# Patient Record
Sex: Male | Born: 1965 | Hispanic: No | Marital: Married | Smoking: Never smoker
Health system: Southern US, Community
[De-identification: ages and names within clinical notes are randomized; demographics above are authoritative.]

## PROBLEM LIST (undated history)

## (undated) DIAGNOSIS — E785 Hyperlipidemia, unspecified: Secondary | ICD-10-CM

## (undated) DIAGNOSIS — K219 Gastro-esophageal reflux disease without esophagitis: Secondary | ICD-10-CM

## (undated) HISTORY — DX: Gastro-esophageal reflux disease without esophagitis: K21.9

## (undated) HISTORY — DX: Hyperlipidemia, unspecified: E78.5

---

## 2004-09-16 ENCOUNTER — Emergency Department: Payer: Self-pay | Admitting: Emergency Medicine

## 2005-05-22 ENCOUNTER — Emergency Department: Payer: Self-pay | Admitting: Emergency Medicine

## 2006-05-24 ENCOUNTER — Emergency Department: Payer: Self-pay | Admitting: Emergency Medicine

## 2012-02-09 ENCOUNTER — Emergency Department (HOSPITAL_COMMUNITY)
Admission: EM | Admit: 2012-02-09 | Discharge: 2012-02-09 | Discharge status: Against Medical Advice | Payer: Self-pay | Attending: Emergency Medicine | Admitting: Emergency Medicine

## 2012-02-09 ENCOUNTER — Encounter (HOSPITAL_COMMUNITY): Payer: Self-pay | Admitting: Emergency Medicine

## 2012-02-09 DIAGNOSIS — R209 Unspecified disturbances of skin sensation: Secondary | ICD-10-CM | POA: Insufficient documentation

## 2012-02-09 DIAGNOSIS — M79609 Pain in unspecified limb: Secondary | ICD-10-CM | POA: Insufficient documentation

## 2012-02-09 NOTE — ED Notes (Signed)
C/o left leg had episode of numbness, aching now, itching. Started last night. Wears compression socks suggested by private doctor

## 2012-02-09 NOTE — ED Notes (Signed)
Has to "fidget" and constantly move legs at night also , unable to sit still.

## 2012-02-09 NOTE — ED Notes (Signed)
Called for room without answer.  

## 2015-12-21 ENCOUNTER — Ambulatory Visit: Admission: RE | Admit: 2015-12-21 | Payer: 59 | Source: Ambulatory Visit | Admitting: Unknown Physician Specialty

## 2015-12-21 ENCOUNTER — Encounter: Admission: RE | Payer: Self-pay | Source: Ambulatory Visit

## 2015-12-21 SURGERY — COLONOSCOPY WITH PROPOFOL
Anesthesia: General

## 2016-10-10 DIAGNOSIS — Z0000 Encounter for general adult medical examination without abnormal findings: Secondary | ICD-10-CM | POA: Diagnosis not present

## 2016-10-17 DIAGNOSIS — Z0000 Encounter for general adult medical examination without abnormal findings: Secondary | ICD-10-CM | POA: Diagnosis not present

## 2016-12-18 DIAGNOSIS — L821 Other seborrheic keratosis: Secondary | ICD-10-CM | POA: Diagnosis not present

## 2016-12-18 DIAGNOSIS — L719 Rosacea, unspecified: Secondary | ICD-10-CM | POA: Diagnosis not present

## 2017-02-03 DIAGNOSIS — H5310 Unspecified subjective visual disturbances: Secondary | ICD-10-CM | POA: Diagnosis not present

## 2017-09-01 DIAGNOSIS — R05 Cough: Secondary | ICD-10-CM | POA: Diagnosis not present

## 2017-10-17 DIAGNOSIS — M19011 Primary osteoarthritis, right shoulder: Secondary | ICD-10-CM | POA: Diagnosis not present

## 2017-10-17 DIAGNOSIS — M67911 Unspecified disorder of synovium and tendon, right shoulder: Secondary | ICD-10-CM | POA: Diagnosis not present

## 2017-10-23 DIAGNOSIS — Z0000 Encounter for general adult medical examination without abnormal findings: Secondary | ICD-10-CM | POA: Diagnosis not present

## 2017-10-26 DIAGNOSIS — Z0000 Encounter for general adult medical examination without abnormal findings: Secondary | ICD-10-CM | POA: Diagnosis not present

## 2017-11-03 DIAGNOSIS — Z862 Personal history of diseases of the blood and blood-forming organs and certain disorders involving the immune mechanism: Secondary | ICD-10-CM | POA: Insufficient documentation

## 2017-11-03 DIAGNOSIS — R7303 Prediabetes: Secondary | ICD-10-CM | POA: Insufficient documentation

## 2017-11-17 DIAGNOSIS — J069 Acute upper respiratory infection, unspecified: Secondary | ICD-10-CM | POA: Diagnosis not present

## 2017-11-17 DIAGNOSIS — R05 Cough: Secondary | ICD-10-CM | POA: Diagnosis not present

## 2017-11-30 DIAGNOSIS — M25511 Pain in right shoulder: Secondary | ICD-10-CM | POA: Diagnosis not present

## 2017-12-09 DIAGNOSIS — M25511 Pain in right shoulder: Secondary | ICD-10-CM | POA: Diagnosis not present

## 2017-12-11 DIAGNOSIS — M25511 Pain in right shoulder: Secondary | ICD-10-CM | POA: Diagnosis not present

## 2017-12-16 DIAGNOSIS — M25511 Pain in right shoulder: Secondary | ICD-10-CM | POA: Diagnosis not present

## 2017-12-18 DIAGNOSIS — M25511 Pain in right shoulder: Secondary | ICD-10-CM | POA: Diagnosis not present

## 2017-12-23 DIAGNOSIS — M25511 Pain in right shoulder: Secondary | ICD-10-CM | POA: Diagnosis not present

## 2017-12-25 DIAGNOSIS — M25511 Pain in right shoulder: Secondary | ICD-10-CM | POA: Diagnosis not present

## 2018-02-24 DIAGNOSIS — S90222A Contusion of left lesser toe(s) with damage to nail, initial encounter: Secondary | ICD-10-CM | POA: Diagnosis not present

## 2018-02-24 DIAGNOSIS — L821 Other seborrheic keratosis: Secondary | ICD-10-CM | POA: Diagnosis not present

## 2018-03-09 DIAGNOSIS — M9902 Segmental and somatic dysfunction of thoracic region: Secondary | ICD-10-CM | POA: Diagnosis not present

## 2018-03-09 DIAGNOSIS — M542 Cervicalgia: Secondary | ICD-10-CM | POA: Diagnosis not present

## 2018-03-09 DIAGNOSIS — M9901 Segmental and somatic dysfunction of cervical region: Secondary | ICD-10-CM | POA: Diagnosis not present

## 2018-03-11 DIAGNOSIS — M9901 Segmental and somatic dysfunction of cervical region: Secondary | ICD-10-CM | POA: Diagnosis not present

## 2018-03-11 DIAGNOSIS — M542 Cervicalgia: Secondary | ICD-10-CM | POA: Diagnosis not present

## 2018-03-11 DIAGNOSIS — M9902 Segmental and somatic dysfunction of thoracic region: Secondary | ICD-10-CM | POA: Diagnosis not present

## 2018-03-12 DIAGNOSIS — Z862 Personal history of diseases of the blood and blood-forming organs and certain disorders involving the immune mechanism: Secondary | ICD-10-CM | POA: Diagnosis not present

## 2018-03-12 DIAGNOSIS — R7303 Prediabetes: Secondary | ICD-10-CM | POA: Diagnosis not present

## 2018-03-15 DIAGNOSIS — M9902 Segmental and somatic dysfunction of thoracic region: Secondary | ICD-10-CM | POA: Diagnosis not present

## 2018-03-15 DIAGNOSIS — M9901 Segmental and somatic dysfunction of cervical region: Secondary | ICD-10-CM | POA: Diagnosis not present

## 2018-03-15 DIAGNOSIS — M542 Cervicalgia: Secondary | ICD-10-CM | POA: Diagnosis not present

## 2018-03-16 DIAGNOSIS — M9902 Segmental and somatic dysfunction of thoracic region: Secondary | ICD-10-CM | POA: Diagnosis not present

## 2018-03-16 DIAGNOSIS — M542 Cervicalgia: Secondary | ICD-10-CM | POA: Diagnosis not present

## 2018-03-16 DIAGNOSIS — M9901 Segmental and somatic dysfunction of cervical region: Secondary | ICD-10-CM | POA: Diagnosis not present

## 2018-03-18 DIAGNOSIS — M9901 Segmental and somatic dysfunction of cervical region: Secondary | ICD-10-CM | POA: Diagnosis not present

## 2018-03-18 DIAGNOSIS — M542 Cervicalgia: Secondary | ICD-10-CM | POA: Diagnosis not present

## 2018-03-18 DIAGNOSIS — M9902 Segmental and somatic dysfunction of thoracic region: Secondary | ICD-10-CM | POA: Diagnosis not present

## 2018-03-23 DIAGNOSIS — M9902 Segmental and somatic dysfunction of thoracic region: Secondary | ICD-10-CM | POA: Diagnosis not present

## 2018-03-23 DIAGNOSIS — M9901 Segmental and somatic dysfunction of cervical region: Secondary | ICD-10-CM | POA: Diagnosis not present

## 2018-03-23 DIAGNOSIS — M542 Cervicalgia: Secondary | ICD-10-CM | POA: Diagnosis not present

## 2018-05-12 DIAGNOSIS — M9901 Segmental and somatic dysfunction of cervical region: Secondary | ICD-10-CM | POA: Diagnosis not present

## 2018-05-12 DIAGNOSIS — M542 Cervicalgia: Secondary | ICD-10-CM | POA: Diagnosis not present

## 2018-05-12 DIAGNOSIS — M9902 Segmental and somatic dysfunction of thoracic region: Secondary | ICD-10-CM | POA: Diagnosis not present

## 2018-05-17 DIAGNOSIS — M9901 Segmental and somatic dysfunction of cervical region: Secondary | ICD-10-CM | POA: Diagnosis not present

## 2018-05-17 DIAGNOSIS — M542 Cervicalgia: Secondary | ICD-10-CM | POA: Diagnosis not present

## 2018-05-17 DIAGNOSIS — M9902 Segmental and somatic dysfunction of thoracic region: Secondary | ICD-10-CM | POA: Diagnosis not present

## 2018-07-21 DIAGNOSIS — L719 Rosacea, unspecified: Secondary | ICD-10-CM | POA: Diagnosis not present

## 2018-08-30 DIAGNOSIS — M9901 Segmental and somatic dysfunction of cervical region: Secondary | ICD-10-CM | POA: Diagnosis not present

## 2018-08-30 DIAGNOSIS — M542 Cervicalgia: Secondary | ICD-10-CM | POA: Diagnosis not present

## 2018-08-30 DIAGNOSIS — M9902 Segmental and somatic dysfunction of thoracic region: Secondary | ICD-10-CM | POA: Diagnosis not present

## 2018-09-17 DIAGNOSIS — J069 Acute upper respiratory infection, unspecified: Secondary | ICD-10-CM | POA: Diagnosis not present

## 2018-10-14 DIAGNOSIS — R7303 Prediabetes: Secondary | ICD-10-CM | POA: Diagnosis not present

## 2018-10-14 DIAGNOSIS — Z862 Personal history of diseases of the blood and blood-forming organs and certain disorders involving the immune mechanism: Secondary | ICD-10-CM | POA: Diagnosis not present

## 2018-10-25 DIAGNOSIS — M9901 Segmental and somatic dysfunction of cervical region: Secondary | ICD-10-CM | POA: Diagnosis not present

## 2018-10-25 DIAGNOSIS — M9902 Segmental and somatic dysfunction of thoracic region: Secondary | ICD-10-CM | POA: Diagnosis not present

## 2018-10-25 DIAGNOSIS — M542 Cervicalgia: Secondary | ICD-10-CM | POA: Diagnosis not present

## 2018-10-29 DIAGNOSIS — Z862 Personal history of diseases of the blood and blood-forming organs and certain disorders involving the immune mechanism: Secondary | ICD-10-CM | POA: Diagnosis not present

## 2018-10-29 DIAGNOSIS — Z0000 Encounter for general adult medical examination without abnormal findings: Secondary | ICD-10-CM | POA: Diagnosis not present

## 2018-10-29 DIAGNOSIS — R7303 Prediabetes: Secondary | ICD-10-CM | POA: Diagnosis not present

## 2018-11-05 DIAGNOSIS — Z0000 Encounter for general adult medical examination without abnormal findings: Secondary | ICD-10-CM | POA: Diagnosis not present

## 2018-11-12 DIAGNOSIS — Z01818 Encounter for other preprocedural examination: Secondary | ICD-10-CM | POA: Diagnosis not present

## 2018-11-12 DIAGNOSIS — Z1211 Encounter for screening for malignant neoplasm of colon: Secondary | ICD-10-CM | POA: Diagnosis not present

## 2019-01-28 ENCOUNTER — Ambulatory Visit: Admit: 2019-01-28 | Payer: 59 | Admitting: Unknown Physician Specialty

## 2019-01-28 SURGERY — COLONOSCOPY WITH PROPOFOL
Anesthesia: General

## 2019-12-14 DIAGNOSIS — E781 Pure hyperglyceridemia: Secondary | ICD-10-CM | POA: Insufficient documentation

## 2020-02-28 ENCOUNTER — Other Ambulatory Visit: Payer: Self-pay

## 2020-02-29 ENCOUNTER — Ambulatory Visit (INDEPENDENT_AMBULATORY_CARE_PROVIDER_SITE_OTHER): Payer: 59 | Admitting: Gastroenterology

## 2020-02-29 ENCOUNTER — Other Ambulatory Visit: Payer: Self-pay

## 2020-02-29 ENCOUNTER — Encounter: Payer: Self-pay | Admitting: Gastroenterology

## 2020-02-29 VITALS — BP 105/66 | HR 85 | Temp 98.1°F | Ht 70.0 in | Wt 187.8 lb

## 2020-02-29 DIAGNOSIS — K219 Gastro-esophageal reflux disease without esophagitis: Secondary | ICD-10-CM | POA: Diagnosis not present

## 2020-02-29 MED ORDER — OMEPRAZOLE 20 MG PO CPDR: 20.0000 mg | DELAYED_RELEASE_CAPSULE | Freq: Every day | ORAL | 0 refills | Status: DC

## 2020-02-29 NOTE — Patient Instructions (Addendum)
Please start taking your Omeprazole 20 MG 1 tablet daily for the next 30 days.     Gastroesophageal Reflux Disease, Adult Gastroesophageal reflux (GER) happens when acid from the stomach flows up into the tube that connects the mouth and the stomach (esophagus). Normally, food travels down the esophagus and stays in the stomach to be digested. With GER, food and stomach acid sometimes move back up into the esophagus. You may have a disease called gastroesophageal reflux disease (GERD) if the reflux:  Happens often.  Causes frequent or very bad symptoms.  Causes problems such as damage to the esophagus. When this happens, the esophagus becomes sore and swollen (inflamed). Over time, GERD can make small holes (ulcers) in the lining of the esophagus. What are the causes? This condition is caused by a problem with the muscle between the esophagus and the stomach. When this muscle is weak or not normal, it does not close properly to keep food and acid from coming back up from the stomach. The muscle can be weak because of:  Tobacco use.  Pregnancy.  Having a certain type of hernia (hiatal hernia).  Alcohol use.  Certain foods and drinks, such as coffee, chocolate, onions, and peppermint. What increases the risk? You are more likely to develop this condition if you:  Are overweight.  Have a disease that affects your connective tissue.  Use NSAID medicines. What are the signs or symptoms? Symptoms of this condition include:  Heartburn.  Difficult or painful swallowing.  The feeling of having a lump in the throat.  A bitter taste in the mouth.  Bad breath.  Having a lot of saliva.  Having an upset or bloated stomach.  Belching.  Chest pain. Different conditions can cause chest pain. Make sure you see your doctor if you have chest pain.  Shortness of breath or noisy breathing (wheezing).  Ongoing (chronic) cough or a cough at night.  Wearing away of the surface of  teeth (tooth enamel).  Weight loss. How is this treated? Treatment will depend on how bad your symptoms are. Your doctor may suggest:  Changes to your diet.  Medicine.  Surgery. Follow these instructions at home: Eating and drinking   Follow a diet as told by your doctor. You may need to avoid foods and drinks such as: ? Coffee and tea (with or without caffeine). ? Drinks that contain alcohol. ? Energy drinks and sports drinks. ? Bubbly (carbonated) drinks or sodas. ? Chocolate and cocoa. ? Peppermint and mint flavorings. ? Garlic and onions. ? Horseradish. ? Spicy and acidic foods. These include peppers, chili powder, curry powder, vinegar, hot sauces, and BBQ sauce. ? Citrus fruit juices and citrus fruits, such as oranges, lemons, and limes. ? Tomato-based foods. These include red sauce, chili, salsa, and pizza with red sauce. ? Fried and fatty foods. These include donuts, french fries, potato chips, and high-fat dressings. ? High-fat meats. These include hot dogs, rib eye steak, sausage, ham, and bacon. ? High-fat dairy items, such as whole milk, butter, and cream cheese.  Eat small meals often. Avoid eating large meals.  Avoid drinking large amounts of liquid with your meals.  Avoid eating meals during the 2-3 hours before bedtime.  Avoid lying down right after you eat.  Do not exercise right after you eat. Lifestyle   Do not use any products that contain nicotine or tobacco. These include cigarettes, e-cigarettes, and chewing tobacco. If you need help quitting, ask your doctor.  Try to lower  your stress. If you need help doing this, ask your doctor.  If you are overweight, lose an amount of weight that is healthy for you. Ask your doctor about a safe weight loss goal. General instructions  Pay attention to any changes in your symptoms.  Take over-the-counter and prescription medicines only as told by your doctor. Do not take aspirin, ibuprofen, or other NSAIDs  unless your doctor says it is okay.  Wear loose clothes. Do not wear anything tight around your waist.  Raise (elevate) the head of your bed about 6 inches (15 cm).  Avoid bending over if this makes your symptoms worse.  Keep all follow-up visits as told by your doctor. This is important. Contact a doctor if:  You have new symptoms.  You lose weight and you do not know why.  You have trouble swallowing or it hurts to swallow.  You have wheezing or a cough that keeps happening.  Your symptoms do not get better with treatment.  You have a hoarse voice. Get help right away if:  You have pain in your arms, neck, jaw, teeth, or back.  You feel sweaty, dizzy, or light-headed.  You have chest pain or shortness of breath.  You throw up (vomit) and your throw-up looks like blood or coffee grounds.  You pass out (faint).  Your poop (stool) is bloody or black.  You cannot swallow, drink, or eat. Summary  If a person has gastroesophageal reflux disease (GERD), food and stomach acid move back up into the esophagus and cause symptoms or problems such as damage to the esophagus.  Treatment will depend on how bad your symptoms are.  Follow a diet as told by your doctor.  Take all medicines only as told by your doctor. This information is not intended to replace advice given to you by your health care provider. Make sure you discuss any questions you have with your health care provider. Document Revised: 03/17/2018 Document Reviewed: 03/17/2018 Elsevier Patient Education  [place].

## 2020-03-02 NOTE — Progress Notes (Signed)
Benjamin Cox [address]  [place]  [place], [place] [address]  Main: [phone]  Fax: [phone]   Gastroenterology Consultation  Referring Provider:     Dion Body, MD Primary Care Physician:  Dion Body, MD Reason for Consultation:    Reflux        HPI:    Chief complaint: Heartburn  Benjamin Cox is a 54 y.o. y/o male referred for consultation & management  by Dr. Dion Body, MD.  Patient reports heartburn 3-4 times a week.  Does not take anything daily for it.  Takes as needed over-the-counter antacids which help.  No dysphagia to solids or liquids.  Does have some discomfort swallowing big pills, but not otherwise.  No nausea or vomiting.  No altered bowel habits or blood in stool.  No prior EGD or colonoscopy.  No family history of colon cancer.  History reviewed. No pertinent past medical history.  History reviewed. No pertinent surgical history.  Prior to Admission medications   Medication Sig Start Date End Date Taking? Authorizing Provider  albuterol (VENTOLIN HFA) 108 (90 Base) MCG/ACT inhaler Inhale 2 puffs into the lungs every 4 (four) hours as needed. 09/01/17 09/11/20 Yes [provider]  Biotin 10 MG TABS Take 1 tablet by mouth daily.   Yes [provider]  calcium-vitamin D (OSCAL WITH D) 250-125 MG-UNIT per tablet Take 1 tablet by mouth daily.   Yes [provider]  finasteride (PROPECIA) 1 MG tablet Take 1 mg by mouth daily.   Yes [provider]  Multiple Vitamin (MULTIVITAMIN) capsule Take 1 capsule by mouth daily.   Yes [provider]  vitamin C (ASCORBIC ACID) 500 MG tablet Take 500 mg by mouth daily.   Yes [provider]  omeprazole (PRILOSEC) 20 MG capsule Take 1 capsule (20 mg total) by mouth daily. 02/29/20   Virgel Manifold, MD    History reviewed. No pertinent family history.   Social History   Tobacco Use  . Smoking status: Never Smoker  .  Smokeless tobacco: Never Used  Substance Use Topics  . Alcohol use: No  . Drug use: No    Allergies as of 02/29/2020 - Review Complete 02/09/2012  Allergen Reaction Noted  . Other Other (See Comments) 05/26/2014    Review of Systems:    All systems reviewed and negative except where noted in HPI.   Physical Exam:  BP 105/66   Pulse 85   Temp 98.1 F (36.7 C) (Oral)   Ht 5\' 10"  (1.778 m)   Wt 187 lb 12.8 oz (85.2 kg)   BMI 26.95 kg/m  No LMP for male patient. Psych:  Alert and cooperative. Normal mood and affect. General:   Alert,  Well-developed, well-nourished, pleasant and cooperative in NAD Head:  Normocephalic and atraumatic. Eyes:  Sclera clear, no icterus.   Conjunctiva pink. Ears:  Normal auditory acuity. Nose:  No deformity, discharge, or lesions. Mouth:  No deformity or lesions,oropharynx pink & moist. Neck:  Supple; no masses or thyromegaly. Abdomen:  Normal bowel sounds.  No bruits.  Soft, non-tender and non-distended without masses, hepatosplenomegaly or hernias noted.  No guarding or rebound tenderness.    Msk:  Symmetrical without gross deformities. Good, equal movement & strength bilaterally. Pulses:  Normal pulses noted. Extremities:  No clubbing or edema.  No cyanosis. Neurologic:  Alert and oriented x3;  grossly normal neurologically. Skin:  Intact without significant lesions or rashes. No jaundice. Lymph Nodes:  No significant  cervical adenopathy. Psych:  Alert and cooperative. Normal mood and affect.   Labs: CBC No results found for: WBC, RBC, HGB, HCT, PLT, MCV, MCH, MCHC, RDW, LYMPHSABS, MONOABS, EOSABS, BASOSABS CMP  No results found for: NA, K, CL, CO2, GLUCOSE, BUN, CREATININE, CALCIUM, PROT, ALBUMIN, AST, ALT, ALKPHOS, BILITOT, GFRNONAA, GFRAA  Imaging Studies: No results found.  Assessment and Plan:   Benjamin Cox is a 54 y.o. y/o male has been referred for reflux  Patient educated extensively on acid reflux lifestyle  modification, including buying a bed wedge, not eating 3 hrs before bedtime, diet modifications, and handout given for the same.   Start low-dose PPI once daily for short duration due to frequent symptoms.  (Risks of PPI use were discussed with patient including bone loss, C. Diff diarrhea, pneumonia, infections, CKD, electrolyte abnormalities.  Pt. Verbalizes understanding and chooses to continue the medication.)  We discussed option of proceeding with EGD and risks and benefits were discussed in detail. Pt would like to wait and see if medication helps before proceeding which is reasonable.  We also discussed screening colonoscopy and risks and benefits of the same.  Patient refuses at this time.  I have encouraged him to call us to schedule this when he is ready to get this done.  Follow-up in clinic in 6 to 8 weeks to reassess symptoms and change medications or further management as needed    Dr Melodie Bouillon  Speech recognition software was used to dictate the above note.

## 2020-03-22 ENCOUNTER — Ambulatory Visit: Payer: 59 | Admitting: Gastroenterology

## 2020-04-19 ENCOUNTER — Telehealth: Payer: Self-pay

## 2020-04-19 NOTE — Telephone Encounter (Signed)
Patient called to see if he could be sooner then 05/02/20. Informed patient that was the first appointment she would have

## 2020-05-02 ENCOUNTER — Other Ambulatory Visit: Payer: Self-pay

## 2020-05-02 ENCOUNTER — Encounter: Payer: Self-pay | Admitting: Gastroenterology

## 2020-05-02 ENCOUNTER — Ambulatory Visit (INDEPENDENT_AMBULATORY_CARE_PROVIDER_SITE_OTHER): Payer: 59 | Admitting: Gastroenterology

## 2020-05-02 VITALS — BP 117/79 | HR 96 | Temp 97.8°F | Wt 188.0 lb

## 2020-05-02 DIAGNOSIS — R1310 Dysphagia, unspecified: Secondary | ICD-10-CM

## 2020-05-02 DIAGNOSIS — K219 Gastro-esophageal reflux disease without esophagitis: Secondary | ICD-10-CM

## 2020-05-02 MED ORDER — OMEPRAZOLE 20 MG PO CPDR: 20.0000 mg | DELAYED_RELEASE_CAPSULE | Freq: Two times a day (BID) | ORAL | 1 refills | Status: DC

## 2020-05-02 NOTE — Patient Instructions (Signed)
Upper Endoscopy, Adult Upper endoscopy is a procedure to look inside the upper GI (gastrointestinal) tract. The upper GI tract is made up of:  The part of the body that moves food from your mouth to your stomach (esophagus).  The stomach.  The first part of your small intestine (duodenum). This procedure is also called esophagogastroduodenoscopy (EGD) or gastroscopy. In this procedure, your health care provider passes a thin, flexible tube (endoscope) through your mouth and down your esophagus into your stomach. A small camera is attached to the end of the tube. Images from the camera appear on a monitor in the exam room. During this procedure, your health care provider may also remove a small piece of tissue to be sent to a lab and examined under a microscope (biopsy). Your health care provider may do an upper endoscopy to diagnose cancers of the upper GI tract. You may also have this procedure to find the cause of other conditions, such as:  Stomach pain.  Heartburn.  Pain or problems when swallowing.  Nausea and vomiting.  Stomach bleeding.  Stomach ulcers. Tell a health care provider about:  Any allergies you have.  All medicines you are taking, including vitamins, herbs, eye drops, creams, and over-the-counter medicines.  Any problems you or family members have had with anesthetic medicines.  Any blood disorders you have.  Any surgeries you have had.  Any medical conditions you have.  Whether you are pregnant or may be pregnant. What are the risks? Generally, this is a safe procedure. However, problems may occur, including:  Infection.  Bleeding.  Allergic reactions to medicines.  A tear or hole (perforation) in the esophagus, stomach, or duodenum. What happens before the procedure? Staying hydrated Follow instructions from your health care provider about hydration, which may include:  Up to 2 hours before the procedure - you may continue to drink clear  liquids, such as water, clear fruit juice, black coffee, and plain tea.  Eating and drinking restrictions Follow instructions from your health care provider about eating and drinking, which may include:  8 hours before the procedure - stop eating heavy meals or foods, such as meat, fried foods, or fatty foods.  6 hours before the procedure - stop eating light meals or foods, such as toast or cereal.  6 hours before the procedure - stop drinking milk or drinks that contain milk.  2 hours before the procedure - stop drinking clear liquids. Medicines Ask your health care provider about:  Changing or stopping your regular medicines. This is especially important if you are taking diabetes medicines or blood thinners.  Taking medicines such as aspirin and ibuprofen. These medicines can thin your blood. Do not take these medicines unless your health care provider tells you to take them.  Taking over-the-counter medicines, vitamins, herbs, and supplements. General instructions  Plan to have someone take you home from the hospital or clinic.  If you will be going home right after the procedure, plan to have someone with you for 24 hours.  Ask your health care provider what steps will be taken to help prevent infection. What happens during the procedure?   An IV will be inserted into one of your veins.  You may be given one or more of the following: ? A medicine to help you relax (sedative). ? A medicine to numb the throat (local anesthetic).  You will lie on your left side on an exam table.  Your health care provider will pass the endoscope through   your mouth and down your esophagus.  Your health care provider will use the scope to check the inside of your esophagus, stomach, and duodenum. Biopsies may be taken.  The endoscope will be removed. The procedure may vary among health care providers and hospitals. What happens after the procedure?  Your blood pressure, heart rate,  breathing rate, and blood oxygen level will be monitored until you leave the hospital or clinic.  Do not drive for 24 hours if you were given a sedative during your procedure.  When your throat is no longer numb, you may be given some fluids to drink.  It is up to you to get the results of your procedure. Ask your health care provider, or the department that is doing the procedure, when your results will be ready. Summary  Upper endoscopy is a procedure to look inside the upper GI tract.  During the procedure, an IV will be inserted into one of your veins. You may be given a medicine to help you relax.  A medicine will be used to numb your throat.  The endoscope will be passed through your mouth and down your esophagus. This information is not intended to replace advice given to you by your health care provider. Make sure you discuss any questions you have with your health care provider. Document Revised: 03/03/2018 Document Reviewed: 02/08/2018 Elsevier Patient Education  2020 Elsevier Inc.   

## 2020-05-03 NOTE — Progress Notes (Signed)
Melodie Bouillon, MD [address]  [address]  [place], [place] [address]  Main: [phone]  Fax: [phone]   Primary Care Physician: Marisue Ivan, MD   Chief Complaint  Patient presents with   Gastroesophageal Reflux    HPI: Benjamin Cox is a 54 y.o. male here for follow-up of reflux.  With Prilosec once daily his heartburn has completely resolved, however he continues to report globus sensation.  Also is now reporting "choking" on solid foods intermittently.  Happened last night with an apple.  No weight loss, no nausea or vomiting.  Current Outpatient Medications  Medication Sig Dispense Refill   Biotin 10 MG TABS Take 1 tablet by mouth daily.     Multiple Vitamin (MULTIVITAMIN) capsule Take 1 capsule by mouth daily.     vitamin C (ASCORBIC ACID) 500 MG tablet Take 500 mg by mouth daily.     omeprazole (PRILOSEC) 20 MG capsule Take 1 capsule (20 mg total) by mouth 2 (two) times daily before a meal. 60 capsule 1   No current facility-administered medications for this visit.    Allergies as of 05/02/2020 - Review Complete 05/02/2020  Allergen Reaction Noted   Other Other (See Comments) 05/26/2014    ROS:  General: Negative for anorexia, weight loss, fever, chills, fatigue, weakness. ENT: Negative for hoarseness, difficulty swallowing , nasal congestion. CV: Negative for chest pain, angina, palpitations, dyspnea on exertion, peripheral edema.  Respiratory: Negative for dyspnea at rest, dyspnea on exertion, cough, sputum, wheezing.  GI: See history of present illness. GU:  Negative for dysuria, hematuria, urinary incontinence, urinary frequency, nocturnal urination.  Endo: Negative for unusual weight change.    Physical Examination:   BP 117/79    Pulse 96    Temp 97.8 F (36.6 C) (Oral)    Wt 188 lb (85.3 kg)    BMI 26.98 kg/m   General: Well-nourished, well-developed in no acute distress.  Eyes: No icterus. Conjunctivae pink. Mouth:  Oropharyngeal mucosa moist and pink , no lesions erythema or exudate. Neck: Supple, Trachea midline Abdomen: Bowel sounds are normal, nontender, nondistended, no hepatosplenomegaly or masses, no abdominal bruits or hernia , no rebound or guarding.   Extremities: No lower extremity edema. No clubbing or deformities. Neuro: Alert and oriented x 3.  Grossly intact. Skin: Warm and dry, no jaundice.   Psych: Alert and cooperative, normal mood and affect.   Labs: CMP  No results found for: NA, K, CL, CO2, GLUCOSE, BUN, CREATININE, CALCIUM, PROT, ALBUMIN, AST, ALT, ALKPHOS, BILITOT, GFRNONAA, GFRAA No results found for: WBC, HGB, HCT, MCV, PLT  Imaging Studies: No results found.  Assessment and Plan:   Benjamin Cox is a 54 y.o. y/o male here for follow-up of reflux  Heartburn is well controlled, but continues to report globus sensation daily.  Now is also reporting intermittent dysphagia  We recommended proceeding with an upper endoscopy at this time, specifically due to his dysphagia  However, patient would like to continue with conservative management at this time and if symptoms do not improve in the next 3 to 4 weeks, he will call us to schedule an upper endoscopy at that point  Increase PPI to twice daily and patient advised to let us know in 3 to 4 weeks if symptoms are not any better  (Risks of PPI use were discussed with patient including bone loss, C. Diff diarrhea, pneumonia, infections, CKD, electrolyte abnormalities.  Pt. Verbalizes understanding and chooses to continue the medication.)  Dr Melodie Bouillon

## 2020-07-02 ENCOUNTER — Telehealth: Payer: Self-pay | Admitting: Gastroenterology

## 2020-07-02 NOTE — Telephone Encounter (Signed)
Patient LVM stating he would be out of town on appt date 07/04/20 and to cancel his appt..I LVM for patient to call our office if he needs to reschedule.

## 2020-07-04 ENCOUNTER — Ambulatory Visit: Payer: 59 | Admitting: Gastroenterology

## 2022-01-15 ENCOUNTER — Ambulatory Visit: Payer: 59 | Admitting: Cardiovascular Disease

## 2022-02-13 ENCOUNTER — Ambulatory Visit (INDEPENDENT_AMBULATORY_CARE_PROVIDER_SITE_OTHER): Payer: Commercial Managed Care - PPO | Admitting: Cardiovascular Disease

## 2022-02-13 ENCOUNTER — Encounter: Payer: Self-pay | Admitting: Cardiovascular Disease

## 2022-02-13 VITALS — BP 122/82 | HR 68 | Ht 71.0 in | Wt 188.4 lb

## 2022-02-13 DIAGNOSIS — R0602 Shortness of breath: Secondary | ICD-10-CM | POA: Diagnosis not present

## 2022-02-13 DIAGNOSIS — R072 Precordial pain: Secondary | ICD-10-CM

## 2022-02-13 DIAGNOSIS — E785 Hyperlipidemia, unspecified: Secondary | ICD-10-CM

## 2022-02-13 MED ORDER — METOPROLOL TARTRATE 100 MG PO TABS: ORAL_TABLET | ORAL | 0 refills | Status: DC

## 2022-02-13 NOTE — Patient Instructions (Addendum)
Medication Instructions:  Your physician recommends that you continue on your current medications as directed. Please refer to the Current Medication list given to you today.  A one time doe of Metoprolol 100 mg tablet to be taken 2 hours prior to your Cardiac CTA has been sent to your pharmacy.  *If you need a refill on your cardiac medications before your next appointment, please call your pharmacy*   Lab Work: None ordered If you have labs (blood work) drawn today and your tests are completely normal, you will receive your results only by: [place] (if you have MyChart) OR A paper copy in the mail If you have any lab test that is abnormal or we need to change your treatment, we will call you to review the results.   Testing/Procedures: Your physician has requested that you have an echocardiogram. Echocardiography is a painless test that uses sound waves to create images of your heart. It provides your doctor with information about the size and shape of your heart and how well your heart's chambers and valves are working. This procedure takes approximately one hour. There are no restrictions for this procedure.   Your physician has requested that you have cardiac CT. Cardiac computed tomography (CT) is a painless test that uses an x-ray machine to take clear, detailed pictures of your heart. For further information please visit [email or web address]. Please follow instruction sheet as given.     Follow-Up: At [facility], you and your health needs are our priority.  As part of our continuing mission to provide you with exceptional heart care, we have created designated Provider Care Teams.  These Care Teams include your primary Cardiologist (physician) and Advanced Practice Providers (APPs -  Physician Assistants and Nurse Practitioners) who all work together to provide you with the care you need, when you need it.  We recommend signing up for the patient portal called "MyChart".   Sign up information is provided on this After Visit Summary.  MyChart is used to connect with patients for Virtual Visits (Telemedicine).  Patients are able to view lab/test results, encounter notes, upcoming appointments, etc.  Non-urgent messages can be sent to your provider as well.   To learn more about what you can do with MyChart, go to [email or web address].    Your next appointment:   As needed pending test results  The format for your next appointment:   In Person  Provider:   You may see Kathlyn Sacramento, MD or one of the following Advanced Practice Providers on your designated Care Team:   Murray Hodgkins, NP Christell Faith, PA-C Cadence Kathlen Mody, [place]   Other Instructions    Your cardiac CT will be scheduled at one of the below location:    [facility] [address] [place], [place] [address] [phone]    If scheduled at [facility], please arrive 15 mins early for check-in and test prep.  Please follow these instructions carefully (unless otherwise directed):  Hold all erectile dysfunction medications at least 3 days (72 hrs) prior to test.  On the Night Before the Test: Be sure to Drink plenty of water. Do not consume any caffeinated/decaffeinated beverages or chocolate 12 hours prior to your test. Do not take any antihistamines 12 hours prior to your test.  On the Day of the Test: Drink plenty of water until 1 hour prior to the test. Do not eat any food 4 hours prior to the test. You may take  your regular medications prior to the test.  Take metoprolol (Lopressor) two hours prior to test.         After the Test: Drink plenty of water. After receiving IV contrast, you may experience a mild flushed feeling. This is normal. On occasion, you may experience a mild rash up to 24 hours after the test. This is not dangerous. If this occurs, you can take Benadryl 25 mg and increase your  fluid intake. If you experience trouble breathing, this can be serious. If it is severe call 911 IMMEDIATELY. If it is mild, please call our office. If you take any of these medications: Glipizide/Metformin, Avandament, Glucavance, please do not take 48 hours after completing test unless otherwise instructed.  We will call to schedule your test 2-4 weeks out understanding that some insurance companies will need an authorization prior to the service being performed.   For non-scheduling related questions, please contact the cardiac imaging nurse navigator should you have any questions/concerns: Marchia Bond, Cardiac Imaging Nurse Navigator Gordy Clement, Cardiac Imaging Nurse Navigator [facility] Direct Office Dial: [phone]   For scheduling needs, including cancellations and rescheduling, please call [place], [phone].   Important Information About Sugar

## 2022-02-13 NOTE — Progress Notes (Signed)
Cardiology Office Note   Date:  02/14/2022   ID:  Benjamin Cox, DOB [birth year], MRN [id number]  PCP:  Marisue Ivan, MD  Cardiologist:   Lorine Bears, MD   Chief Complaint  Patient presents with   NEW patient-Patient c/o fatigue and swelling in his feet.    Patient also reports DOE. He also notes cramping in the top of his feet (worse on the left side) most nights.      History of Present Illness: Benjamin Cox is a 56 y.o. male who is self-referred for evaluation of exertional dyspnea.  He has no prior cardiac history.  He has no history of hypertension or tobacco use.  He does have diet-controlled diabetes and mild hyperlipidemia.  Family history is negative for premature coronary artery disease.  His father had CVA at the age of 41 as well as hypertension. Over the last 6 months, he noted gradual exertional fatigue and dyspnea with exertion when he tries to exercise.  He tries to go to the gym but has been limited by shortness of breath.  No chest pain or palpitations.  He also increased increased leg edema.  No orthopnea or PND.  No previous cardiac work-up.  Past Medical History:  Diagnosis Date   GERD (gastroesophageal reflux disease)    Hyperlipidemia     History reviewed. No pertinent surgical history.   Current Outpatient Medications  Medication Sig Dispense Refill   Biotin 10 MG TABS Take 1 tablet by mouth daily.     metoprolol tartrate (LOPRESSOR) 100 MG tablet Take 1 tablet (100 mg) 2 hours prior to your Cardiac CTA 1 tablet 0   Multiple Vitamin (MULTIVITAMIN) capsule Take 1 capsule by mouth daily.     No current facility-administered medications for this visit.    Allergies:   Other    Social History:  The patient  reports that he has never smoked. He has never used smokeless tobacco. He reports current alcohol use of about 1.0 standard drink per week. He reports that he does not use drugs.   Family History:  The patient's family history  includes Hypertension in his father; Stroke in his father.    ROS:  Please see the history of present illness.   Otherwise, review of systems are positive for none.   All other systems are reviewed and negative.    PHYSICAL EXAM: VS:  BP 122/82 (BP Location: Right Arm, Patient Position: Sitting, Cuff Size: Normal)   Pulse 68   Ht 5\' 11"  (1.803 m)   Wt 188 lb 6.4 oz (85.5 kg)   SpO2 96%   BMI 26.28 kg/m  , BMI Body mass index is 26.28 kg/m. GEN: Well nourished, well developed, in no acute distress  HEENT: normal  Neck: no JVD, carotid bruits, or masses Cardiac: RRR; no murmurs, rubs, or gallops,no edema  Respiratory:  clear to auscultation bilaterally, normal work of breathing GI: soft, nontender, nondistended, + BS MS: no deformity or atrophy  Skin: warm and dry, no rash Neuro:  Strength and sensation are intact Psych: euthymic mood, full affect   EKG:  EKG is ordered today. The ekg ordered today demonstrates normal sinus rhythm with no significant ST or T wave changes.   Recent Labs: No results found for requested labs within last 8760 hours.    Lipid Panel No results found for: CHOL, TRIG, HDL, CHOLHDL, VLDL, LDLCALC, LDLDIRECT    Wt Readings from Last 3 Encounters:  02/13/22 188 lb 6.4 oz (85.5 kg)  05/02/20 188 lb (85.3 kg)  02/29/20 187 lb 12.8 oz (85.2 kg)          02/13/2022    3:51 PM  PAD Screen  Previous PAD dx? No  Previous surgical procedure? No  Pain with walking? No  Feet/toe relief with dangling? Yes  Painful, non-healing ulcers? No  Extremities discolored? Yes      ASSESSMENT AND PLAN:  1.  Severe exertional dyspnea: Worrisome for anginal equivalent considering his risk factors including diet-controlled diabetes and hyperlipidemia.  EKG does not show any ischemic changes.  I recommend evaluation with cardiac CTA. I also recommend an echocardiogram to evaluate LV systolic and diastolic function.  2.  Mild hyperlipidemia:I reviewed his  most recent labs which showed an LDL of 116.  If cardiac CTA shows evidence of atherosclerosis, we will plan on starting treatment with a statin considering that he has diet-controlled diabetes.    Disposition:   FU as needed based on cardiac testing.  Signed,  Kathlyn Sacramento, MD  02/14/2022 2:36 PM    [facility]

## 2022-02-14 ENCOUNTER — Encounter: Payer: Self-pay | Admitting: Cardiovascular Disease

## 2022-03-05 ENCOUNTER — Telehealth (HOSPITAL_COMMUNITY): Payer: Self-pay | Admitting: Emergency Medicine

## 2022-03-05 NOTE — Telephone Encounter (Signed)
Reaching out to patient to offer assistance regarding upcoming cardiac imaging study; pt verbalizes understanding of appt date/time, parking situation and where to check in, pre-test NPO status and medications ordered, and verified current allergies; name and call back number provided for further questions should they arise Benjamin Bond RN Navigator Cardiac Imaging [facility] [phone] office [phone] cell  Reminded to pick up metoprolol tartrate Denies iv issues

## 2022-03-06 ENCOUNTER — Other Ambulatory Visit: Payer: 59

## 2022-03-06 ENCOUNTER — Ambulatory Visit
Admission: RE | Admit: 2022-03-06 | Discharge: 2022-03-06 | Disposition: A | Discharge status: Home / Self Care | Payer: Commercial Managed Care - PPO | Source: Ambulatory Visit | Attending: Cardiovascular Disease | Admitting: Cardiovascular Disease

## 2022-03-06 DIAGNOSIS — R072 Precordial pain: Secondary | ICD-10-CM

## 2022-03-06 MED ORDER — METOPROLOL TARTRATE 5 MG/5ML IV SOLN
10.0000 mg | Freq: Once | INTRAVENOUS | Status: AC
  Administered 2022-03-06: 10 mg via INTRAVENOUS

## 2022-03-06 MED ORDER — NITROGLYCERIN 0.4 MG SL SUBL
0.8000 mg | SUBLINGUAL_TABLET | Freq: Once | SUBLINGUAL | Status: AC
Start: 2022-03-06 — End: 2022-03-06
  Administered 2022-03-06: 0.8 mg via SUBLINGUAL

## 2022-03-06 MED ORDER — IOHEXOL 350 MG/ML SOLN
75.0000 mL | Freq: Once | INTRAVENOUS | Status: AC | PRN
  Administered 2022-03-06: 75 mL via INTRAVENOUS

## 2022-03-06 NOTE — Progress Notes (Signed)
Patient tolerated procedure well. Ambulate w/o difficulty. Denies light headedness or being dizzy. Sitting in chair drinking water provided. Encouraged to drink extra water today and reasoning explained. Verbalized understanding. All questions answered. ABC intact. No further needs. Discharge from procedure area w/o issues.   °

## 2022-03-07 ENCOUNTER — Telehealth: Payer: Self-pay

## 2022-03-07 DIAGNOSIS — E781 Pure hyperglyceridemia: Secondary | ICD-10-CM

## 2022-03-07 MED ORDER — ROSUVASTATIN CALCIUM 10 MG PO TABS
10.0000 mg | ORAL_TABLET | Freq: Every day | ORAL | 5 refills | Status: DC
Start: 2022-03-07 — End: 2022-07-14

## 2022-03-07 NOTE — Telephone Encounter (Addendum)
Called the patient to give Cardiac CTA results. Called both telephone numbers listed for the patient. cell rings out and msg sts that the call cannot be completed at this time. Lmtcb on the pt home line.

## 2022-03-07 NOTE — Telephone Encounter (Signed)
Patient returning call and req call back on [phone] number.

## 2022-03-07 NOTE — Telephone Encounter (Signed)
-----   Message from Iran Ouch, MD sent at 03/07/2022  7:50 AM EDT ----- Inform patient that cardiac CTA showed mild coronary plaque buildup with no evidence of obstructive disease.  This is good news overall but we do need to treat his cholesterol to prevent further progression.  I recommend adding rosuvastatin 10 mg daily.  Check lipid and liver profile in 6 weeks.  Schedule a follow-up visit with me after his echocardiogram.

## 2022-03-07 NOTE — Telephone Encounter (Signed)
Patient made aware of Cardiac CTA results and Dr. Jari Sportsman recommendation. Patient is agreeable with starting rosuvastatin 10 mg daily. Rx sent to the patients pharmacy. Order placed for 6 wk fasting lipid and lft [facility]. Appt scheduled with Dr. Kirke Corin after the patients echo. Patient would prefer a Fri appt. Appt scheduled on 04/11/22 @10 :20 am with Dr. . Patient made aware of the appt date, time, and location. Patient questions answered with verbalized understanding. Patient voiced appreciation for the call.

## 2022-03-28 ENCOUNTER — Ambulatory Visit (INDEPENDENT_AMBULATORY_CARE_PROVIDER_SITE_OTHER): Payer: Commercial Managed Care - PPO

## 2022-03-28 DIAGNOSIS — R0602 Shortness of breath: Secondary | ICD-10-CM

## 2022-03-28 LAB — ECHOCARDIOGRAM COMPLETE
AR max vel: 3.03 cm2
AV Area VTI: 2.73 cm2
AV Area mean vel: 2.59 cm2
AV Mean grad: 3 mmHg
AV Peak grad: 4.2 mmHg
Ao pk vel: 1.03 m/s
Area-P 1/2: 2 cm2
Calc EF: 52 %
S' Lateral: 3 cm
Single Plane A2C EF: 50.6 %
Single Plane A4C EF: 53 %

## 2022-04-01 ENCOUNTER — Telehealth: Payer: Self-pay

## 2022-04-01 NOTE — Telephone Encounter (Signed)
-----   Message from Iran Ouch, MD sent at 03/31/2022  5:43 PM EDT ----- Inform patient that echo was normal.

## 2022-04-01 NOTE — Telephone Encounter (Signed)
Called the patient to review echo results. Lmtcb. 

## 2022-04-08 NOTE — Telephone Encounter (Signed)
2nd attempt to contact the patient to review echo results. Lmtcb.

## 2022-04-11 ENCOUNTER — Ambulatory Visit (INDEPENDENT_AMBULATORY_CARE_PROVIDER_SITE_OTHER): Payer: Commercial Managed Care - PPO | Admitting: Cardiovascular Disease

## 2022-04-11 ENCOUNTER — Encounter: Payer: Self-pay | Admitting: Cardiovascular Disease

## 2022-04-11 VITALS — BP 104/68 | HR 69 | Wt 188.2 lb

## 2022-04-11 DIAGNOSIS — E785 Hyperlipidemia, unspecified: Secondary | ICD-10-CM

## 2022-04-11 DIAGNOSIS — R931 Abnormal findings on diagnostic imaging of heart and coronary circulation: Secondary | ICD-10-CM | POA: Diagnosis not present

## 2022-04-11 NOTE — Progress Notes (Signed)
Cardiology Office Note   Date:  04/11/2022   ID:  Benjamin Cox, DOB 1966/05/16, MRN [id number]  PCP:  Marisue Ivan, MD  Cardiologist:   Lorine Bears, MD   Chief Complaint  Patient presents with   Other    F/u echo/CT no complaints today. Meds reviewed verbally with pt.      History of Present Illness: Benjamin Cox is a 56 y.o. male who is here for a follow-up visit regarding exertional dyspnea. He has no history of hypertension or tobacco use.  He does have diet-controlled diabetes and mild hyperlipidemia.  Family history is negative for premature coronary artery disease.  His father had CVA at the age of 63 as well as hypertension. He was seen recently for fatigue and exertional dyspnea without chest pain.  EKG was normal. Cardiac CTA showed mildly elevated calcium score at 56 with minimal stenosis in the proximal RCA and left circumflex.  CT scan of the lungs was unremarkable.  Rosuvastatin was added.  Echocardiogram showed normal LV systolic function and no significant valvular abnormalities. He has been doing well without chest pain.  He has been going to the gym more often with some improvement in symptoms.   Past Medical History:  Diagnosis Date   GERD (gastroesophageal reflux disease)    Hyperlipidemia     History reviewed. No pertinent surgical history.   Current Outpatient Medications  Medication Sig Dispense Refill   Biotin 10 MG TABS Take 1 tablet by mouth daily.     Multiple Vitamin (MULTIVITAMIN) capsule Take 1 capsule by mouth daily.     rosuvastatin (CRESTOR) 10 MG tablet Take 1 tablet (10 mg total) by mouth daily. 30 tablet 5   No current facility-administered medications for this visit.    Allergies:   Patient has no known allergies.    Social History:  The patient  reports that he has never smoked. He has never used smokeless tobacco. He reports that he does not currently use alcohol after a past usage of about 1.0 standard drink of  alcohol per week. He reports that he does not use drugs.   Family History:  The patient's family history includes Hypertension in his father; Stroke in his father.    ROS:  Please see the history of present illness.   Otherwise, review of systems are positive for none.   All other systems are reviewed and negative.    PHYSICAL EXAM: VS:  BP 104/68 (BP Location: Left Arm, Patient Position: Sitting, Cuff Size: Normal)   Pulse 69   Wt 188 lb 4 oz (85.4 kg)   SpO2 98%   BMI 26.26 kg/m  , BMI Body mass index is 26.26 kg/m. GEN: Well nourished, well developed, in no acute distress  HEENT: normal  Neck: no JVD, carotid bruits, or masses Cardiac: RRR; no murmurs, rubs, or gallops,no edema  Respiratory:  clear to auscultation bilaterally, normal work of breathing GI: soft, nontender, nondistended, + BS MS: no deformity or atrophy  Skin: warm and dry, no rash Neuro:  Strength and sensation are intact Psych: euthymic mood, full affect   EKG:  EKG is not ordered today.    Recent Labs: No results found for requested labs within last 365 days.    Lipid Panel No results found for: "CHOL", "TRIG", "HDL", "CHOLHDL", "VLDL", "LDLCALC", "LDLDIRECT"    Wt Readings from Last 3 Encounters:  04/11/22 188 lb 4 oz (85.4 kg)  02/13/22 188 lb 6.4 oz (85.5 kg)  05/02/20 188  lb (85.3 kg)          02/13/2022    3:51 PM  PAD Screen  Previous PAD dx? No  Previous surgical procedure? No  Pain with walking? No  Feet/toe relief with dangling? Yes  Painful, non-healing ulcers? No  Extremities discolored? Yes      ASSESSMENT AND PLAN:  1.  Nonobstructive coronary artery disease: No anginal symptoms.  I do not think his shortness of breath is related to a cardiac etiology given that cardiac CTA showed no obstructive disease and echocardiogram was unremarkable.  2.  Hyperlipidemia: Given the presence of mild nonobstructive coronary artery disease, he was started on rosuvastatin 10 mg daily  which has been well-tolerated.  I requested a follow-up lipid and liver profile.    Disposition:   FU in 1 year.  Signed,  Lorine Bears, MD  04/11/2022 10:43 AM    [facility]

## 2022-04-11 NOTE — Patient Instructions (Signed)
Medication Instructions:  - Your physician recommends that you continue on your current medications as directed. Please refer to the Current Medication list given to you today.  *If you need a refill on your cardiac medications before your next appointment, please call your pharmacy*   Lab Work: - Your physician recommends that you have lab work today:  Air traffic controller at [facility] 1st desk on the right to check in (REGISTRATION)  Lab hours: Monday- Friday (7:30 am- 5:30 pm)   If you have labs (blood work) drawn today and your tests are completely normal, you will receive your results only by: MyChart Message (if you have MyChart) OR A paper copy in the mail If you have any lab test that is abnormal or we need to change your treatment, we will call you to review the results.   Testing/Procedures: - none ordered   Follow-Up: At [facility], you and your health needs are our priority.  As part of our continuing mission to provide you with exceptional heart care, we have created designated Provider Care Teams.  These Care Teams include your primary Cardiologist (physician) and Advanced Practice Providers (APPs -  Physician Assistants and Nurse Practitioners) who all work together to provide you with the care you need, when you need it.  We recommend signing up for the patient portal called "MyChart".  Sign up information is provided on this After Visit Summary.  MyChart is used to connect with patients for Virtual Visits (Telemedicine).  Patients are able to view lab/test results, encounter notes, upcoming appointments, etc.  Non-urgent messages can be sent to your provider as well.   To learn more about what you can do with MyChart, go to [email or web address].    Your next appointment:   1 year(s)  The format for your next appointment:   In Person  Provider:   You may see Lorine Bears, MD or one of the following Advanced Practice Providers on your  designated Care Team:   Nicolasa Ducking, NP Eula Listen, PA-C Cadence Fransico Michael, [place]    Other Instructions N/a  Important Information About Sugar

## 2022-07-11 ENCOUNTER — Other Ambulatory Visit: Payer: Self-pay | Admitting: Cardiovascular Disease

## 2022-08-17 ENCOUNTER — Other Ambulatory Visit: Payer: Self-pay | Admitting: Cardiovascular Disease

## 2023-04-04 IMAGING — CT CT HEART MORP W/ CTA COR W/ SCORE W/ CA W/CM &/OR W/O CM
1 of 14 series · 3 of 20 positions shown, 4 images · non-contrast
Comparison: None.

Addendum:
CLINICAL DATA: Dyspnea on exertion

EXAM:
Cardiac/Coronary  CTA
TECHNIQUE: The patient was scanned on a Siemens Somatom go.Top scanner.

[Series 21: multiphase % cta coronary 0.60 · axial · 0.32mm/px · z∈[-1109,-1048]mm · 3 of 3399 slices shown, 4 images]
[im 850/3399  vessel]
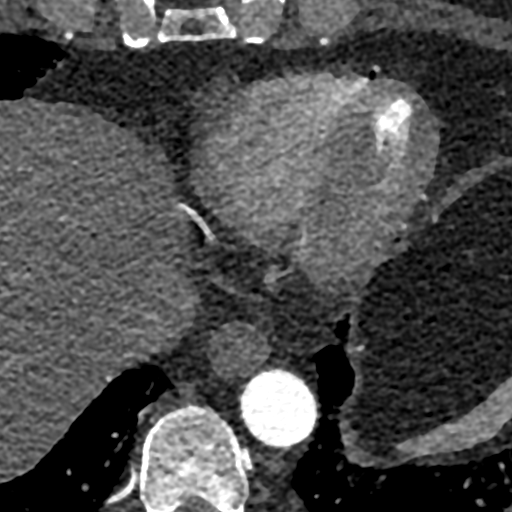
[im 850/3399  lung]
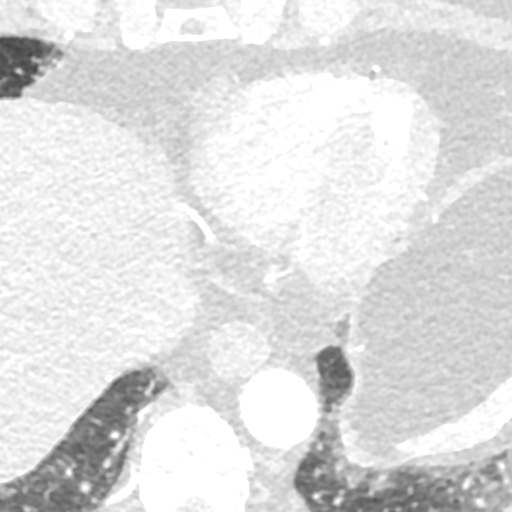
[im 1700/3399  vessel]
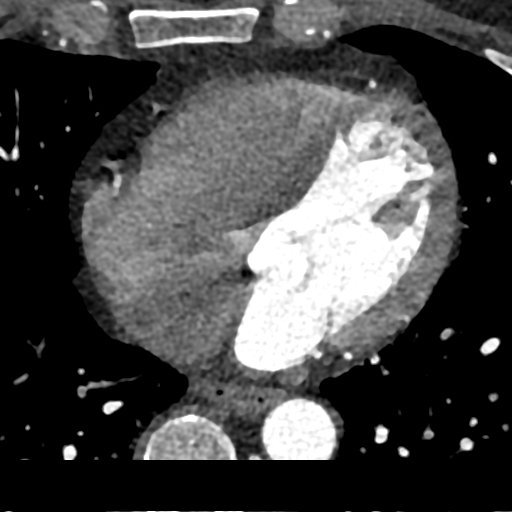
[im 2549/3399  vessel]
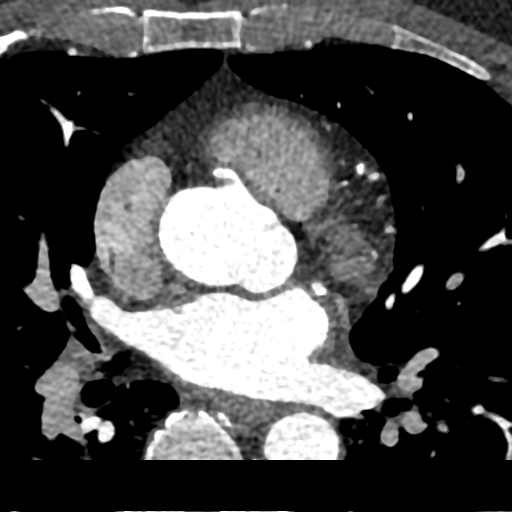

[3 of 20 positions shown; findings below may reference images not displayed]



Aortic Valve:  Trileaflet.  No calcifications.

Coronary Arteries:  Normal coronary origin.  Right dominance.

RCA is a dominant artery that gives rise to PDA and PLA. There is
calcified plaque causing minimal proximal RCA disease (<25%).

Left main gives rise to LAD and LCX arteries.  LM has no disease

LAD has no plaque.

LCX is a non-dominant artery that gives rise to two obtuse marginal
branches. There is calcified plaque in the proximal LCx causing
minimal stenosis (<25%).

Other findings:

Normal pulmonary vein drainage into the left atrium.

Normal left atrial appendage without a thrombus.

Normal size of the pulmonary artery.
IMPRESSION: 1. Coronary calcium score of 56. This was 69th percentile for age
and sex matched control.

2. Normal coronary origin with right dominance.

3. Calcified plaque causing minimal stenosis in the proximal RCA and
LCx (<25%).

4. CAD-RADS 1. Minimal non-obstructive CAD (0-24%). Consider
non-atherosclerotic causes of chest pain.

5. Consider preventive therapy and risk factor modification.

EXAM:
OVER-READ INTERPRETATION  CT CHEST

The following report is an over-read performed by radiologist Dr.
over-read does not include interpretation of cardiac or coronary
anatomy or pathology. The calcium score interpretation by the
cardiologist is attached.
FINDINGS: Limited view of the lung parenchyma demonstrates no suspicious
nodularity. Airways are normal.

Limited view of the mediastinum demonstrates no adenopathy.
Esophagus normal.

Limited view of the upper abdomen unremarkable.

Limited view of the skeleton and chest wall is unremarkable.
IMPRESSION: No significant extracardiac findings.



Aortic Valve:  Trileaflet.  No calcifications.

Coronary Arteries:  Normal coronary origin.  Right dominance.

RCA is a dominant artery that gives rise to PDA and PLA. There is
calcified plaque causing minimal proximal RCA disease (<25%).

Left main gives rise to LAD and LCX arteries.  LM has no disease

LAD has no plaque.

LCX is a non-dominant artery that gives rise to two obtuse marginal
branches. There is calcified plaque in the proximal LCx causing
minimal stenosis (<25%).

Other findings:

Normal pulmonary vein drainage into the left atrium.

Normal left atrial appendage without a thrombus.

Normal size of the pulmonary artery.
IMPRESSION: 1. Coronary calcium score of 56. This was 69th percentile for age
and sex matched control.

2. Normal coronary origin with right dominance.

3. Calcified plaque causing minimal stenosis in the proximal RCA and
LCx (<25%).

4. CAD-RADS 1. Minimal non-obstructive CAD (0-24%). Consider
non-atherosclerotic causes of chest pain.

5. Consider preventive therapy and risk factor modification.

## 2023-07-16 ENCOUNTER — Other Ambulatory Visit: Payer: Self-pay | Admitting: Cardiovascular Disease

## 2023-07-16 NOTE — Telephone Encounter (Signed)
Please contact pt for future appointment. Pt overdue for 12 month f/u. 

## 2023-11-13 NOTE — Telephone Encounter (Signed)
Left vm to call and schedule overdue appointment.
